# Patient Record
Sex: Male | Born: 1985 | Race: White | Hispanic: No | Marital: Married | Smoking: Former smoker
Health system: Southern US, Community
[De-identification: ages and names within clinical notes are randomized; demographics above are authoritative.]

## PROBLEM LIST (undated history)

## (undated) DIAGNOSIS — I10 Essential (primary) hypertension: Secondary | ICD-10-CM

## (undated) DIAGNOSIS — R4589 Other symptoms and signs involving emotional state: Secondary | ICD-10-CM

---

## 2019-01-13 ENCOUNTER — Ambulatory Visit: Payer: Self-pay | Admitting: Physician Assistant

## 2019-12-26 ENCOUNTER — Emergency Department (HOSPITAL_COMMUNITY): Admission: EM | Admit: 2019-12-26 | Discharge: 2019-12-26 | Discharge status: Against Medical Advice | Payer: Self-pay

## 2019-12-26 ENCOUNTER — Other Ambulatory Visit: Payer: Self-pay

## 2019-12-26 ENCOUNTER — Encounter (HOSPITAL_COMMUNITY): Payer: Self-pay | Admitting: Emergency Medicine

## 2019-12-26 ENCOUNTER — Emergency Department (HOSPITAL_COMMUNITY): Payer: Self-pay

## 2019-12-26 ENCOUNTER — Emergency Department (HOSPITAL_COMMUNITY)
Admission: EM | Admit: 2019-12-26 | Discharge: 2019-12-26 | Disposition: A | Discharge status: Home / Self Care | Payer: Self-pay | Attending: Emergency Medicine | Admitting: Emergency Medicine

## 2019-12-26 DIAGNOSIS — I10 Essential (primary) hypertension: Secondary | ICD-10-CM | POA: Insufficient documentation

## 2019-12-26 DIAGNOSIS — Z87891 Personal history of nicotine dependence: Secondary | ICD-10-CM | POA: Insufficient documentation

## 2019-12-26 DIAGNOSIS — U071 COVID-19: Secondary | ICD-10-CM | POA: Insufficient documentation

## 2019-12-26 HISTORY — DX: Essential (primary) hypertension: I10

## 2019-12-26 LAB — POC SARS CORONAVIRUS 2 AG -  ED: SARS Coronavirus 2 Ag: POSITIVE — AB

## 2019-12-26 MED ORDER — ONDANSETRON HCL 4 MG/2ML IJ SOLN
4.0000 mg | Freq: Once | INTRAMUSCULAR | Status: AC
  Administered 2019-12-26: 4 mg via INTRAVENOUS
  Filled 2019-12-26: qty 2

## 2019-12-26 MED ORDER — ONDANSETRON 4 MG PO TBDP: ORAL_TABLET | ORAL | 0 refills | Status: DC

## 2019-12-26 MED ORDER — ACETAMINOPHEN 500 MG PO TABS
1000.0000 mg | ORAL_TABLET | Freq: Once | ORAL | Status: AC
  Administered 2019-12-26: 1000 mg via ORAL
  Filled 2019-12-26: qty 2

## 2019-12-26 MED ORDER — LISINOPRIL 10 MG PO TABS
20.0000 mg | ORAL_TABLET | Freq: Once | ORAL | Status: AC
  Administered 2019-12-26: 21:00:00 20 mg via ORAL
  Filled 2019-12-26: qty 2

## 2019-12-26 NOTE — ED Provider Notes (Signed)
[facility] EMERGENCY DEPARTMENT Provider Note   CSN: [id number] Arrival date & time: 12/26/19  2022     History Chief Complaint  Patient presents with  . Fever    Lance Gutierrez is a 34 y.o. male.  Lance Gutierrez is a 34 y.o. male with history of hypertension, who presents to the ED for evaluation of 2 days of fevers, chills, cough, headache, nausea and vomiting.  He reports symptoms initially started with a cough that has been nonproductive.  Last night he felt feverish but was not able to check his temperature, had chills, took Tylenol Cold and flu this morning with some improvement and took a second dose around 4:30 or 5 PM this evening.  He denies associated rhinorrhea or sore throat.  He has had some mild shortness of breath, but denies chest pain.  Last night he had nausea with multiple episodes of vomiting, he has not vomited since 645 this morning but is continued to have nausea and decreased appetite.  He states he only has abdominal pain right before he needs to vomit, denies diarrhea.  No blood in the emesis.  He has not had a previous Covid infection or the Covid vaccine, denies any known sick contacts.  Noted to be hypertensive, states he has not taken his blood pressure medicine in several weeks, states he thinks he supposed to be on lisinopril, has the medication at home just does not take it regularly.        Past Medical History:  Diagnosis Date  . Hypertension     There are no problems to display for this patient.   History reviewed. No pertinent surgical history.     No family history on file.  Social History   Tobacco Use  . Smoking status: Former Research scientist (life sciences)  . Smokeless tobacco: Never Used  Substance Use Topics  . Alcohol use: Not Currently  . Drug use: Yes    Types: Marijuana    Home Medications Prior to Admission medications   Medication Sig Start Date End Date Taking? Authorizing Provider  ondansetron (ZOFRAN ODT) 4 MG disintegrating tablet 4mg  ODT q4  hours prn nausea/vomit 12/26/19   Jacqlyn Larsen, PA-C    Allergies    Penicillins  Review of Systems   Review of Systems  Constitutional: Positive for chills and fever.  HENT: Negative.   Respiratory: Positive for cough and shortness of breath.   Cardiovascular: Negative for chest pain.  Gastrointestinal: Positive for diarrhea, nausea and vomiting. Negative for abdominal pain.  Genitourinary: Negative for dysuria and frequency.  Musculoskeletal: Negative for arthralgias, myalgias, neck pain and neck stiffness.  Skin: Negative for color change and rash.  Neurological: Positive for headaches. Negative for dizziness, syncope and light-headedness.  All other systems reviewed and are negative.   Physical Exam Updated Vital Signs BP (!) 189/142   Pulse 71   Temp 97.8 F (36.6 C)   Resp 18   Ht 5\' 8"  (1.727 m)   Wt 108.9 kg   SpO2 100%   BMI 36.49 kg/m   Physical Exam Vitals and nursing note reviewed.  Constitutional:      General: He is not in acute distress.    Appearance: Normal appearance. He is well-developed. He is not ill-appearing or diaphoretic.  HENT:     Head: Normocephalic and atraumatic.     Mouth/Throat:     Mouth: Mucous membranes are moist.     Pharynx: Oropharynx is clear.  Eyes:     General:  Right eye: No discharge.        Left eye: No discharge.  Neck:     Comments: No rigidity Cardiovascular:     Rate and Rhythm: Normal rate and regular rhythm.     Pulses: Normal pulses.     Heart sounds: Normal heart sounds. No murmur. No friction rub. No gallop.   Pulmonary:     Effort: Pulmonary effort is normal. No respiratory distress.     Breath sounds: Normal breath sounds.     Comments: Respirations equal and unlabored, patient able to speak in full sentences, lungs clear to auscultation bilaterally Abdominal:     General: Bowel sounds are normal. There is no distension.     Palpations: Abdomen is soft. There is no mass.     Tenderness: There is  no abdominal tenderness. There is no guarding.     Comments: Abdomen soft, nondistended, nontender to palpation in all quadrants without guarding or peritoneal signs  Musculoskeletal:        General: No deformity.     Cervical back: Neck supple.  Lymphadenopathy:     Cervical: No cervical adenopathy.  Skin:    General: Skin is warm and dry.     Capillary Refill: Capillary refill takes less than 2 seconds.  Neurological:     Mental Status: He is alert and oriented to person, place, and time.  Psychiatric:        Mood and Affect: Mood normal.        Behavior: Behavior normal.     ED Results / Procedures / Treatments   Labs (all labs ordered are listed, but only abnormal results are displayed) Labs Reviewed  POC SARS CORONAVIRUS 2 AG -  ED - Abnormal; Notable for the following components:      Result Value   SARS Coronavirus 2 Ag POSITIVE (*)    All other components within normal limits    EKG None  Radiology DG Chest Port 1 View  Result Date: 12/26/2019 CLINICAL DATA:  Fever nausea vomiting EXAM: PORTABLE CHEST 1 VIEW COMPARISON:  08/07/2019, 06/24/2019 FINDINGS: The heart size and mediastinal contours are within normal limits. Both lungs are clear. The visualized skeletal structures are unremarkable. IMPRESSION: No active disease. Electronically Signed   By: Jasmine Pang M.D.   On: 12/26/2019 21:05    Procedures Procedures (including critical care time)  Medications Ordered in ED Medications  acetaminophen (TYLENOL) tablet 1,000 mg (1,000 mg Oral Given 12/26/19 2112)  ondansetron (ZOFRAN) injection 4 mg (4 mg Intravenous Given 12/26/19 2112)  lisinopril (ZESTRIL) tablet 20 mg (20 mg Oral Given 12/26/19 2126)    ED Course  I have reviewed the triage vital signs and the nursing notes.  Pertinent labs & imaging results that were available during my care of the patient were reviewed by me and considered in my medical decision making (see chart for details).    MDM  Rules/Calculators/A&P                       Patient presents with fevers, chills, cough, nausea, vomiting and headache over the past 2 days, tested positive for Covid which explains patient's symptoms.  His chest x-ray is clear, he ambulated in the room and maintained normal O2 sats.  Patient is noted to be hypertensive today does not have symptoms to suggest hypertensive urgency or emergency and has not been taking his blood pressure medications over the past few weeks, he was given a dose of  his home lisinopril today and counseled on the importance of taking his medication, discussed long-term consequences of untreated hypertension.  At this time I feel patient is stable for discharge home with continued symptomatic treatment of his COVID-19 infection, discussed appropriate outpatient medications, Zofran provided for nausea.  Discussed strict return precautions.  Patient expresses understanding and agreement with plan.  Discharged home in good condition.  Lance Gutierrez was evaluated in Emergency Department on 12/26/2019 for the symptoms described in the history of present illness. He was evaluated in the context of the global COVID-19 pandemic, which necessitated consideration that the patient might be at risk for infection with the SARS-CoV-2 virus that causes COVID-19. Institutional protocols and algorithms that pertain to the evaluation of patients at risk for COVID-19 are in a state of rapid change based on information released by regulatory bodies including the CDC and federal and state organizations. These policies and algorithms were followed during the patient's care in the ED.  Final Clinical Impression(s) / ED Diagnoses Final diagnoses:  COVID-19 virus infection    Rx / DC Orders ED Discharge Orders         Ordered    ondansetron (ZOFRAN ODT) 4 MG disintegrating tablet     12/26/19 2144           Dartha Lodge, PA-C 12/26/19 2201    Milagros Loll, MD 12/26/19 2358

## 2019-12-26 NOTE — ED Notes (Signed)
O2 remained above 96% while ambulating, HR in 110s. pt denies SOB, steady gait. PA notified

## 2019-12-26 NOTE — Discharge Instructions (Addendum)
You have tested positive for COVID-19 virus.  Please continue to quarantine at home and monitor your symptoms closely. You chest x-ray was clear. Antibiotics are not helpful in treating viral infection, the virus should run its course in about 10-14 days. Please make sure you are drinking plenty of fluids. You can treat your symptoms supportively with tylenol for fevers and pains, and over the counter cough syrups and throat lozenges to help with cough.  You can use prescribed Zofran as needed for nausea and vomiting.  If your symptoms are not improving please follow up with you Primary doctor.   I recommend that you purchase a home pulse ox to help better monitor your oxygen at home, if you start to have increased work of breathing or shortness of breath or your oxygen drops below 90% please immediately return to the hospital for reevaluation   If you develop persistent fevers, shortness of breath or difficulty breathing, chest pain, severe headache and neck pain, persistent nausea and vomiting or other new or concerning symptoms return to the Emergency department.  Please TAKE YOUR BLOOD PRESSURE MEDICINE

## 2019-12-26 NOTE — ED Triage Notes (Signed)
Pt c/o fever, n/v, cough and headache x 2 days.

## 2019-12-27 ENCOUNTER — Telehealth: Payer: Self-pay | Admitting: Unknown Physician Specialty

## 2019-12-27 NOTE — Telephone Encounter (Signed)
Called to discuss with patient about Covid symptoms and the use of bamlanivimab, a monoclonal antibody infusion for those with mild to moderate Covid symptoms and at a high risk of hospitalization.  Pt is qualified for this infusion at the [facility] infusion center due to [employer] not set up and unable to leave message

## 2020-04-16 ENCOUNTER — Other Ambulatory Visit: Payer: Self-pay

## 2020-04-16 ENCOUNTER — Encounter (HOSPITAL_COMMUNITY): Payer: Self-pay | Admitting: Emergency Medicine

## 2020-04-16 ENCOUNTER — Emergency Department (HOSPITAL_COMMUNITY)
Admission: EM | Admit: 2020-04-16 | Discharge: 2020-04-17 | Disposition: A | Discharge status: Home / Self Care | Payer: Self-pay | Attending: Emergency Medicine | Admitting: Emergency Medicine

## 2020-04-16 DIAGNOSIS — Z87891 Personal history of nicotine dependence: Secondary | ICD-10-CM | POA: Insufficient documentation

## 2020-04-16 DIAGNOSIS — L551 Sunburn of second degree: Secondary | ICD-10-CM | POA: Insufficient documentation

## 2020-04-16 DIAGNOSIS — I10 Essential (primary) hypertension: Secondary | ICD-10-CM | POA: Insufficient documentation

## 2020-04-16 NOTE — ED Triage Notes (Signed)
Pt c/o sunburn to his back. Redness and diffuse blisters noted.

## 2020-04-17 MED ORDER — OXYCODONE HCL 5 MG PO TABS
5.0000 mg | ORAL_TABLET | Freq: Once | ORAL | Status: AC
  Administered 2020-04-17: 5 mg via ORAL
  Filled 2020-04-17: qty 1

## 2020-04-17 MED ORDER — OXYCODONE HCL 5 MG PO TABS: 5.0000 mg | ORAL_TABLET | ORAL | 0 refills | Status: DC | PRN
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
## 2021-04-21 IMAGING — DX DG CHEST 1V PORT
1 series · 1 of 1 positions shown · non-contrast
Comparison: 08/07/2019, 06/24/2019

CLINICAL DATA: Fever nausea vomiting

EXAM:
PORTABLE CHEST 1 VIEW

[chest ap grid]
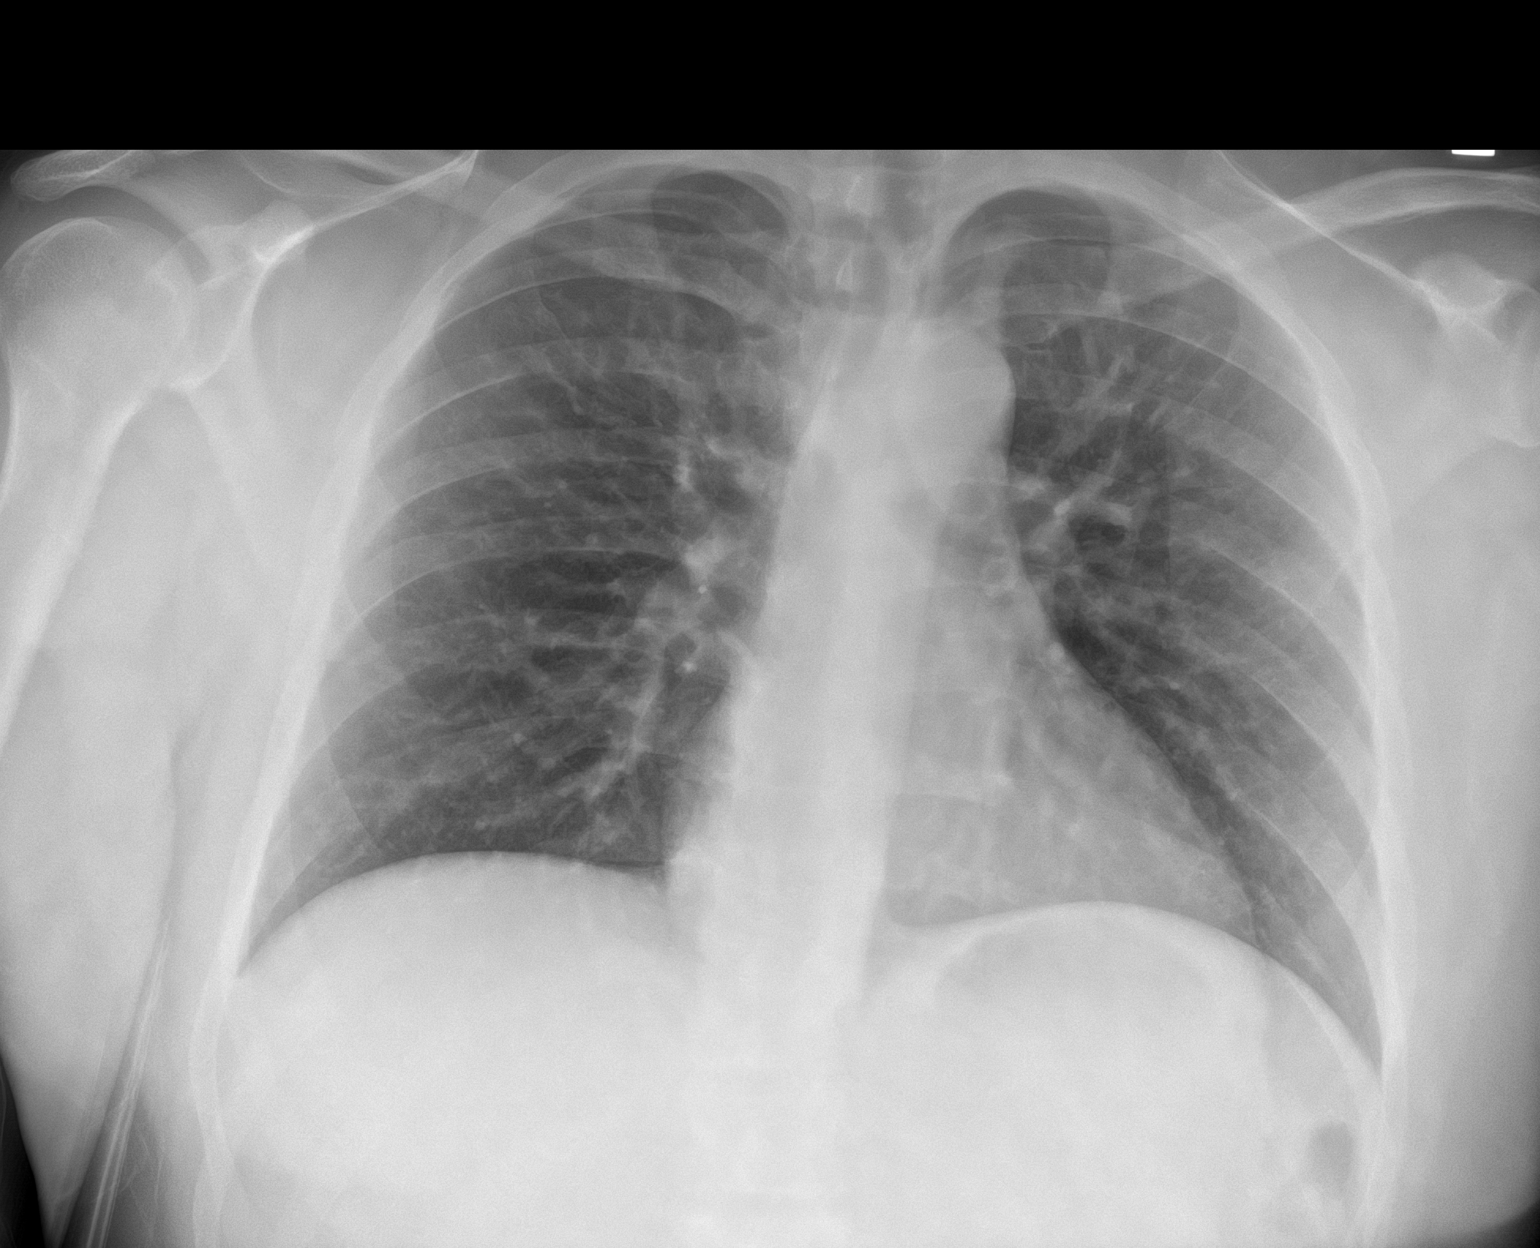

[1 of 1 positions shown; findings below may reference images not displayed]

FINDINGS: The heart size and mediastinal contours are within normal limits.
Both lungs are clear. The visualized skeletal structures are
unremarkable.
IMPRESSION: No active disease.

## 2024-03-09 ENCOUNTER — Emergency Department (HOSPITAL_COMMUNITY)
Admission: EM | Admit: 2024-03-09 | Discharge: 2024-03-09 | Disposition: A | Discharge status: Other Acute Inpatient Hospital | Payer: Self-pay | Attending: Emergency Medicine | Admitting: Emergency Medicine

## 2024-03-09 ENCOUNTER — Emergency Department (HOSPITAL_COMMUNITY): Payer: Self-pay

## 2024-03-09 ENCOUNTER — Encounter (HOSPITAL_COMMUNITY): Payer: Self-pay

## 2024-03-09 ENCOUNTER — Other Ambulatory Visit: Payer: Self-pay

## 2024-03-09 ENCOUNTER — Inpatient Hospital Stay (HOSPITAL_COMMUNITY)
Admission: AD | Admit: 2024-03-09 | Discharge: 2024-03-12 | DRG: 885 | Disposition: A | Discharge status: Home / Self Care | Source: Intra-hospital | Attending: Psychiatry | Admitting: Psychiatry

## 2024-03-09 ENCOUNTER — Encounter (HOSPITAL_COMMUNITY): Payer: Self-pay | Admitting: Family Medicine

## 2024-03-09 DIAGNOSIS — F322 Major depressive disorder, single episode, severe without psychotic features: Principal | ICD-10-CM | POA: Diagnosis present

## 2024-03-09 DIAGNOSIS — R45851 Suicidal ideations: Secondary | ICD-10-CM | POA: Diagnosis present

## 2024-03-09 DIAGNOSIS — I10 Essential (primary) hypertension: Secondary | ICD-10-CM | POA: Diagnosis present

## 2024-03-09 DIAGNOSIS — Z818 Family history of other mental and behavioral disorders: Secondary | ICD-10-CM

## 2024-03-09 DIAGNOSIS — F323 Major depressive disorder, single episode, severe with psychotic features: Secondary | ICD-10-CM

## 2024-03-09 DIAGNOSIS — F17210 Nicotine dependence, cigarettes, uncomplicated: Secondary | ICD-10-CM | POA: Diagnosis present

## 2024-03-09 DIAGNOSIS — R4589 Other symptoms and signs involving emotional state: Secondary | ICD-10-CM

## 2024-03-09 DIAGNOSIS — F333 Major depressive disorder, recurrent, severe with psychotic symptoms: Secondary | ICD-10-CM | POA: Insufficient documentation

## 2024-03-09 DIAGNOSIS — Z79899 Other long term (current) drug therapy: Secondary | ICD-10-CM | POA: Insufficient documentation

## 2024-03-09 DIAGNOSIS — E559 Vitamin D deficiency, unspecified: Secondary | ICD-10-CM | POA: Diagnosis present

## 2024-03-09 DIAGNOSIS — F22 Delusional disorders: Secondary | ICD-10-CM | POA: Diagnosis not present

## 2024-03-09 DIAGNOSIS — Z880 Allergy status to penicillin: Secondary | ICD-10-CM | POA: Diagnosis not present

## 2024-03-09 DIAGNOSIS — Z823 Family history of stroke: Secondary | ICD-10-CM | POA: Diagnosis not present

## 2024-03-09 HISTORY — DX: Other symptoms and signs involving emotional state: R45.89

## 2024-03-09 LAB — CBC
HCT: 46.3 % (ref 39.0–52.0)
Hemoglobin: 15.8 g/dL (ref 13.0–17.0)
MCH: 27.2 pg (ref 26.0–34.0)
MCHC: 34.1 g/dL (ref 30.0–36.0)
MCV: 79.7 fL — ABNORMAL LOW (ref 80.0–100.0)
Platelets: 306 10*3/uL (ref 150–400)
RBC: 5.81 MIL/uL (ref 4.22–5.81)
RDW: 13.3 % (ref 11.5–15.5)
WBC: 12.9 10*3/uL — ABNORMAL HIGH (ref 4.0–10.5)
nRBC: 0 % (ref 0.0–0.2)

## 2024-03-09 LAB — COMPREHENSIVE METABOLIC PANEL WITH GFR
ALT: 26 U/L (ref 0–44)
AST: 16 U/L (ref 15–41)
Albumin: 4.3 g/dL (ref 3.5–5.0)
Alkaline Phosphatase: 77 U/L (ref 38–126)
Anion gap: 9 (ref 5–15)
BUN: 15 mg/dL (ref 6–20)
CO2: 24 mmol/L (ref 22–32)
Calcium: 9.2 mg/dL (ref 8.9–10.3)
Chloride: 105 mmol/L (ref 98–111)
Creatinine, Ser: 0.92 mg/dL (ref 0.61–1.24)
GFR, Estimated: >60 mL/min (ref >60)
Glucose, Bld: 115 mg/dL — ABNORMAL HIGH (ref 70–99)
Potassium: 3.5 mmol/L (ref 3.5–5.1)
Sodium: 138 mmol/L (ref 135–145)
Total Bilirubin: 1 mg/dL (ref 0.0–1.2)
Total Protein: 7.3 g/dL (ref 6.5–8.1)

## 2024-03-09 LAB — RAPID URINE DRUG SCREEN, HOSP PERFORMED
Amphetamines: NOT DETECTED
Barbiturates: NOT DETECTED
Benzodiazepines: NOT DETECTED
Cocaine: NOT DETECTED
Opiates: NOT DETECTED
Tetrahydrocannabinol: POSITIVE — AB

## 2024-03-09 LAB — ETHANOL: Alcohol, Ethyl (B): <15 mg/dL (ref <15)

## 2024-03-09 LAB — VITAMIN D 25 HYDROXY (VIT D DEFICIENCY, FRACTURES): Vit D, 25-Hydroxy: 26.31 ng/mL — ABNORMAL LOW (ref 30–100)

## 2024-03-09 MED ORDER — ACETAMINOPHEN 325 MG PO TABS
650.0000 mg | ORAL_TABLET | Freq: Four times a day (QID) | ORAL | Status: DC | PRN
Start: 2024-03-09 — End: 2024-03-12

## 2024-03-09 MED ORDER — AMLODIPINE BESYLATE 10 MG PO TABS
10.0000 mg | ORAL_TABLET | Freq: Every day | ORAL | Status: DC
  Administered 2024-03-10 – 2024-03-12 (×3): 10 mg via ORAL
  Filled 2024-03-09 (×3): qty 1

## 2024-03-09 MED ORDER — AMLODIPINE BESYLATE 5 MG PO TABS
10.0000 mg | ORAL_TABLET | Freq: Once | ORAL | Status: AC
  Administered 2024-03-09: 10 mg via ORAL
  Filled 2024-03-09: qty 2

## 2024-03-09 MED ORDER — HALOPERIDOL 5 MG PO TABS
5.0000 mg | ORAL_TABLET | Freq: Three times a day (TID) | ORAL | Status: DC | PRN
Start: 2024-03-09 — End: 2024-03-12

## 2024-03-09 MED ORDER — TRAZODONE HCL 50 MG PO TABS
50.0000 mg | ORAL_TABLET | Freq: Every evening | ORAL | Status: DC | PRN
  Administered 2024-03-11: 50 mg via ORAL
  Filled 2024-03-09 (×2): qty 1

## 2024-03-09 MED ORDER — ALUM & MAG HYDROXIDE-SIMETH 200-200-20 MG/5ML PO SUSP: 30.0000 mL | ORAL | Status: DC | PRN

## 2024-03-09 MED ORDER — DIPHENHYDRAMINE HCL 25 MG PO CAPS: 50.0000 mg | ORAL_CAPSULE | Freq: Three times a day (TID) | ORAL | Status: DC | PRN

## 2024-03-09 MED ORDER — LORAZEPAM 2 MG/ML IJ SOLN: 2.0000 mg | Freq: Three times a day (TID) | INTRAMUSCULAR | Status: DC | PRN

## 2024-03-09 MED ORDER — MAGNESIUM HYDROXIDE 400 MG/5ML PO SUSP: 30.0000 mL | Freq: Every day | ORAL | Status: DC | PRN

## 2024-03-09 MED ORDER — DIPHENHYDRAMINE HCL 50 MG/ML IJ SOLN: 50.0000 mg | Freq: Three times a day (TID) | INTRAMUSCULAR | Status: DC | PRN

## 2024-03-09 MED ORDER — HYDROXYZINE HCL 50 MG PO TABS
50.0000 mg | ORAL_TABLET | Freq: Once | ORAL | Status: AC
  Administered 2024-03-09: 50 mg via ORAL
  Filled 2024-03-09: qty 1

## 2024-03-09 MED ORDER — HYDROXYZINE HCL 25 MG PO TABS
25.0000 mg | ORAL_TABLET | Freq: Three times a day (TID) | ORAL | Status: DC | PRN
  Administered 2024-03-10: 25 mg via ORAL
  Filled 2024-03-09 (×2): qty 1

## 2024-03-09 MED ORDER — CLONIDINE HCL 0.1 MG PO TABS
0.1000 mg | ORAL_TABLET | Freq: Once | ORAL | Status: AC
  Administered 2024-03-09: 0.1 mg via ORAL
  Filled 2024-03-09: qty 1

## 2024-03-09 MED ORDER — OLANZAPINE 5 MG PO TBDP: 5.0000 mg | ORAL_TABLET | Freq: Two times a day (BID) | ORAL | Status: DC | PRN

## 2024-03-09 MED ORDER — OLANZAPINE 5 MG PO TBDP
5.0000 mg | ORAL_TABLET | Freq: Two times a day (BID) | ORAL | Status: DC
  Filled 2024-03-09 (×2): qty 1

## 2024-03-09 MED ORDER — HALOPERIDOL LACTATE 5 MG/ML IJ SOLN: 5.0000 mg | Freq: Three times a day (TID) | INTRAMUSCULAR | Status: DC | PRN

## 2024-03-09 MED ORDER — HALOPERIDOL LACTATE 5 MG/ML IJ SOLN: 10.0000 mg | Freq: Three times a day (TID) | INTRAMUSCULAR | Status: DC | PRN
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
## 2024-03-09 NOTE — ED Notes (Signed)
 MD made aware of Patients current Blood Pressure.

## 2024-03-09 NOTE — ED Notes (Signed)
 Pt dressed out 2 personal belongings bags placed in locker Security called to wand pt

## 2024-03-09 NOTE — Progress Notes (Signed)
 Pt has been accepted to [facility] on 03/09/2024 Bed assignment:300-1  Pt meets inpatient criteria per Cydney Draft, NP   Attending Physician will be Nadir Linnie Riches, MD   Report can be called [phone].   Pt can arrive pending improved BP   Care Team Notified: [facility] AC Linsey Strader, Cydney Draft, NP, Ewell Holes, RN

## 2024-03-09 NOTE — Progress Notes (Signed)
 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  Will zyprexa cause him work problems?
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
## 2024-03-09 NOTE — Tx Team (Signed)
 Initial Treatment Plan 03/09/2024 7:10 PM [name] [id number]    PATIENT STRESSORS: Financial difficulties   Marital or family conflict     PATIENT STRENGTHS: Capable of independent living  Forensic psychologist fund of knowledge  Motivation for treatment/growth  Work skills    PATIENT IDENTIFIED PROBLEMS: Conflict with mother  SI with plan to shoot self  Financial problems                 DISCHARGE CRITERIA:  Improved stabilization in mood, thinking, and/or behavior Need for constant or close observation no longer present  PRELIMINARY DISCHARGE PLAN: Return to previous living arrangement  PATIENT/FAMILY INVOLVEMENT: This treatment plan has been presented to and reviewed with the patient, Lance Gutierrez.  The patient has been given the opportunity to ask questions and make suggestions.  Christinia Cradle, RN 03/09/2024, 7:10 PM

## 2024-03-09 NOTE — Plan of Care (Signed)

## 2024-03-09 NOTE — BH Assessment (Signed)
 Notified RN via secure chat ready to complete TTS evaluation...Aaron AasAaron Aas pending response

## 2024-03-09 NOTE — Group Note (Signed)
 Date:  03/09/2024 Time:  9:36 PM  Group Topic/Focus:  Wrap-Up Group:   The focus of this group is to help patients review their daily goal of treatment and discuss progress on daily workbooks.    Additional Comments:  Pt was encouraged, but opted out of attending wrap up group this evening.   Eligah Grow 03/09/2024, 9:36 PM

## 2024-03-09 NOTE — ED Provider Notes (Addendum)
  Physical Exam  BP (!) 194/112 (BP Location: Right Arm) Comment: doesnt take meds like he is supposed to  Pulse 93   Temp 98.1 F (36.7 C) (Oral)   Resp 17   Ht 5\' 6"  (1.676 m)   Wt 96.2 kg   SpO2 98%   BMI 34.22 kg/m   Physical Exam  Procedures  Procedures  ED Course / MDM    Medical Decision Making Amount and/or Complexity of Data Reviewed Labs: ordered.  Risk Prescription drug management. Decision regarding hospitalization.   Received in signout.  Suicidal.  Had gone and plan to shoot himself in head.  IVC had been done.  Patient is medically cleared at this time.       Mozell Arias, MD 03/09/24 [phone]  Patient is hypertensive.  Noncompliant with his medications.  I think this is not the cause of the suicidality.  Will start some oral Norvasc.  Head CT had been added.  Head CT reassuring.  Patient is medically cleared.  Would continue blood pressure medicines but does not need his blood pressure acutely lowered.    Mozell Arias, MD 03/09/24 1301  Further discussion with patient.  Was on potentially lisinopril .  Has been off for couple years now.  States has had hypertension since he has been in his 67s.  Do not think we need acute lowering of the blood pressure.  Likely is compensated to this high blood pressure over the last years and do not want to lower it faster than with oral medicines.    Mozell Arias, MD 03/09/24 [phone]

## 2024-03-09 NOTE — Group Note (Signed)
 LCSW Group Therapy Note   Group Date: 03/09/2024 Start Time: 1100 End Time: 1200   Participation:  patient was admitted today (he was not in the hospital when the group was held)  Type of Therapy:  Group Therapy  Topic:  Finding Balance: Using Wise Mind for Thoughtful Decisions  Objective:  To help participants understand and apply the concept of Agustina Aldrich Mind to make balanced, thoughtful decisions by integrating emotion and logic.  Goals: Learn the differences between Emotional Mind, Reasonable Mind, and [employer]. Recognize personal signs of Emotional and Reasonable Mind. Practice using [employer] in real-life scenarios.  Therapeutic Modalities: Elements of Dialectical Behavior Therapy (DBT):  Mindfulness (noticing thoughts and emotions without judgment), Emotion Regulation (understanding and managing emotional responses), Distress Tolerance (coping with difficult situations without making them worse), Wise Mind (integrating emotion and reason for balanced decision-making) Elements of Cognitive Behavioral Therapy (CBT):  Identifying automatic thoughts, Challenging cognitive distortions, Using logic to reframe unhelpful thinking patterns  Summary:  This class focused on Wise Mind - DBT's concept of balancing Emotional Mind and Reasonable Mind. We identified when we're in each state and practiced using Wise Mind to respond thoughtfully in real-life situations. By combining emotion and logic, participants can improve decision-making, manage challenges, and enhance relationships.   Lance Gutierrez O Lance Gutierrez, LCSWA 03/09/2024  4:39 PM

## 2024-03-10 ENCOUNTER — Encounter (HOSPITAL_COMMUNITY): Payer: Self-pay | Admitting: Family Medicine

## 2024-03-10 ENCOUNTER — Encounter (HOSPITAL_COMMUNITY): Payer: Self-pay

## 2024-03-10 DIAGNOSIS — F322 Major depressive disorder, single episode, severe without psychotic features: Principal | ICD-10-CM

## 2024-03-10 DIAGNOSIS — Z046 Encounter for general psychiatric examination, requested by authority: Secondary | ICD-10-CM

## 2024-03-10 DIAGNOSIS — X749XXA Intentional self-harm by unspecified firearm discharge, initial encounter: Secondary | ICD-10-CM

## 2024-03-10 DIAGNOSIS — E559 Vitamin D deficiency, unspecified: Secondary | ICD-10-CM | POA: Diagnosis present

## 2024-03-10 DIAGNOSIS — Z8659 Personal history of other mental and behavioral disorders: Secondary | ICD-10-CM

## 2024-03-10 HISTORY — DX: Personal history of other mental and behavioral disorders: Z86.59

## 2024-03-10 HISTORY — DX: Intentional self-harm by unspecified firearm discharge, initial encounter: X74.9XXA

## 2024-03-10 HISTORY — DX: Major depressive disorder, single episode, severe without psychotic features: F32.2

## 2024-03-10 HISTORY — DX: Encounter for general psychiatric examination, requested by authority: Z04.6

## 2024-03-10 LAB — HEMOGLOBIN A1C
Hgb A1c MFr Bld: 5.2 % (ref 4.8–5.6)
Mean Plasma Glucose: 102.54 mg/dL

## 2024-03-10 LAB — TSH: TSH: 2.775 u[IU]/mL (ref 0.350–4.500)

## 2024-03-10 LAB — VITAMIN B12: Vitamin B-12: 316 pg/mL (ref 180–914)

## 2024-03-10 LAB — LIPID PANEL
Cholesterol: 130 mg/dL (ref 0–200)
HDL: 36 mg/dL — ABNORMAL LOW (ref >40)
LDL Cholesterol: 85 mg/dL (ref 0–99)
Total CHOL/HDL Ratio: 3.6 ratio
Triglycerides: 46 mg/dL (ref <150)
VLDL: 9 mg/dL (ref 0–40)

## 2024-03-10 LAB — FOLATE: Folate: 9.2 ng/mL (ref >5.9)

## 2024-03-10 MED ORDER — ESCITALOPRAM OXALATE 5 MG PO TABS
5.0000 mg | ORAL_TABLET | Freq: Every day | ORAL | Status: DC
  Administered 2024-03-11: 5 mg via ORAL
  Filled 2024-03-10: qty 1

## 2024-03-10 MED ORDER — RISPERIDONE 1 MG PO TBDP: 1.0000 mg | ORAL_TABLET | Freq: Two times a day (BID) | ORAL | Status: DC

## 2024-03-10 MED ORDER — VITAMIN D (ERGOCALCIFEROL) 1.25 MG (50000 UNIT) PO CAPS
50000.0000 [IU] | ORAL_CAPSULE | Freq: Every day | ORAL | Status: DC
  Administered 2024-03-10 – 2024-03-12 (×3): 50000 [IU] via ORAL
  Filled 2024-03-10 (×3): qty 1

## 2024-03-10 MED ORDER — LISINOPRIL 20 MG PO TABS
20.0000 mg | ORAL_TABLET | Freq: Every day | ORAL | Status: DC
  Administered 2024-03-10 – 2024-03-12 (×3): 20 mg via ORAL
  Filled 2024-03-10 (×3): qty 1

## 2024-03-10 NOTE — Group Note (Signed)
 Date:  03/10/2024 Time:  9:12 AM  Group Topic/Focus:  Goals Group:   The focus of this group is to help patients establish daily goals to achieve during treatment and discuss how the patient can incorporate goal setting into their daily lives to aide in recovery.    Participation Level:  Did Not Attend   Lance Gutierrez 03/10/2024, 9:12 AM

## 2024-03-10 NOTE — Progress Notes (Signed)
   03/09/24 2239  Psych Admission Type (Psych Patients Only)  Admission Status Involuntary  Psychosocial Assessment  Patient Complaints None  Eye Contact Fair  Facial Expression Anxious  Affect Anxious  Speech Logical/coherent  Interaction Assertive  Motor Activity Other (Comment) (WDL)  Appearance/Hygiene Unremarkable  Behavior Characteristics Cooperative  Mood Depressed;Anxious;Preoccupied  Thought Process  Coherency WDL  Content WDL  Delusions None reported or observed  Perception WDL  Hallucination None reported or observed  Judgment Impaired  Confusion None  Danger to Self  Current suicidal ideation? Denies  Agreement Not to Harm Self Yes  Description of Agreement verbal  Danger to Others  Danger to Others None reported or observed
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
## 2024-03-10 NOTE — Group Note (Signed)
 Recreation Therapy Group Note   Group Topic:Team Building  Group Date: 03/10/2024 Start Time: 0940 End Time: 1005 Facilitators: Lattie Riege-McCall, LRT,CTRS Location: [address] Dayroom   Group Topic: Communication, Team Building, Problem Solving  Goal Area(s) Addresses:  Patient will effectively work with peer towards shared goal.  Patient will identify skills used to make activity successful.  Patient will identify how skills used during activity can be used to reach post d/c goals.   Behavioral Response: None  Intervention: STEM Activity  Activity: Straw Bridge. In teams of 3-5, patients were given 15 plastic drinking straws and an equal length of masking tape. Using the materials provided, patients were instructed to build a free standing bridge-like structure to suspend an everyday item (ex: puzzle box) off of the floor or table surface. All materials were required to be used by the team in their design. LRT facilitated post-activity discussion reviewing team process. Patients were encouraged to reflect how the skills used in this activity can be generalized to daily life post discharge.   Education: Pharmacist, community, Scientist, physiological, Discharge Planning   Education Outcome: Acknowledges education/In group clarification offered/Needs additional education.    Affect/Mood: Flat   Participation Level: None   Participation Quality: Independent   Behavior: On-looking   Speech/Thought Process: None   Insight: None   Judgement: None   Modes of Intervention: STEM Activity   Patient Response to Interventions:  Avoidant   Education Outcome:  In group clarification offered    Clinical Observations/Individualized Feedback: Pt came in late, observed and left shortly after coming to group.     Plan: Continue to engage patient in RT group sessions 2-3x/week.   Kol Consuegra-McCall, LRT,CTRS 03/10/2024 12:42 PM

## 2024-03-10 NOTE — Plan of Care (Signed)
  Problem: Activity: Goal: Interest or engagement in activities will improve Outcome: Progressing   Patient denies SI, HI, and AVH. Patient has had no behavioral dyscontrol. Patient was noted to have an elevated blood pressure. Physician contacted. Patient offered medication that he refused earlier to treat blood pressure but continued to refuse.
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
## 2024-03-10 NOTE — Group Note (Signed)
 Date:  03/10/2024 Time:  8:51 PM  Group Topic/Focus:  Wrap-Up Group:   The focus of this group is to help patients review their daily goal of treatment and discuss progress on daily workbooks.    Additional Comments:  Pt attended the AA meeting this evening.   Lance Gutierrez 03/10/2024, 8:51 PM

## 2024-03-10 NOTE — Group Note (Signed)
 Date:  03/10/2024 Time:  9:49 PM  Group Topic/Focus:  Wrap-Up Group:   The focus of this group is to help patients review their daily goal of treatment and discuss progress on daily workbooks.    Participation Level:  Active  Participation Quality:  Appropriate  Affect:  Appropriate  Cognitive:  Appropriate  Insight: Appropriate  Engagement in Group:  Engaged  Modes of Intervention:  Discussion  Additional Comments:  Pt had a good day  Dellia Ferguson 03/10/2024, 9:49 PM
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
## 2024-03-10 NOTE — BH IP Treatment Plan (Signed)
 Interdisciplinary Treatment and Diagnostic Plan Update  03/10/2024 Time of Session: 10:50 AM Lance Gutierrez MRN: [id number]  Principal Diagnosis: Major depressive disorder, single episode, severe without psychotic features (HCC)  Secondary Diagnoses: Principal Problem:   Major depressive disorder, single episode, severe without psychotic features (HCC) Active Problems:   Vitamin D insufficiency   Current Medications:  Current Facility-Administered Medications  Medication Dose Route Frequency Provider Last Rate Last Admin   acetaminophen  (TYLENOL ) tablet 650 mg  650 mg Oral Q6H PRN Buena Carmine, NP       alum & mag hydroxide-simeth (MAALOX/MYLANTA) 200-200-20 MG/5ML suspension 30 mL  30 mL Oral Q4H PRN Buena Carmine, NP       amLODipine (NORVASC) tablet 10 mg  10 mg Oral Daily Buena Carmine, NP   10 mg at 03/10/24 0730   haloperidol (HALDOL) tablet 5 mg  5 mg Oral TID PRN Buena Carmine, NP       And   diphenhydrAMINE (BENADRYL) capsule 50 mg  50 mg Oral TID PRN Buena Carmine, NP       haloperidol lactate (HALDOL) injection 5 mg  5 mg Intramuscular TID PRN Buena Carmine, NP       And   diphenhydrAMINE (BENADRYL) injection 50 mg  50 mg Intramuscular TID PRN Buena Carmine, NP       And   LORazepam (ATIVAN) injection 2 mg  2 mg Intramuscular TID PRN Buena Carmine, NP       haloperidol lactate (HALDOL) injection 10 mg  10 mg Intramuscular TID PRN Buena Carmine, NP       And   diphenhydrAMINE (BENADRYL) injection 50 mg  50 mg Intramuscular TID PRN Buena Carmine, NP       And   LORazepam (ATIVAN) injection 2 mg  2 mg Intramuscular TID PRN Buena Carmine, NP       Lance Gutierrez ON 03/11/2024] escitalopram (LEXAPRO) tablet 5 mg  5 mg Oral Daily Gutierrez, Lance S, MD       hydrOXYzine (ATARAX) tablet 25 mg  25 mg Oral TID PRN Buena Carmine, NP   25 mg at 03/10/24 1323   lisinopril  (ZESTRIL ) tablet 20 mg  20 mg Oral Daily Gutierrez, Lance S, MD        magnesium hydroxide (MILK OF MAGNESIA) suspension 30 mL  30 mL Oral Daily PRN Buena Carmine, NP       traZODone (DESYREL) tablet 50 mg  50 mg Oral QHS PRN Buena Carmine, NP       Vitamin D (Ergocalciferol) (DRISDOL) 1.25 MG (50000 UNIT) capsule 50,000 Units  50,000 Units Oral Daily Gutierrez, Lance S, MD   50,000 Units at 03/10/24 1441   PTA Medications: Medications Prior to Admission  Medication Sig Dispense Refill Last Dose/Taking   aspirin EC 81 MG tablet Take 81 mg by mouth in the morning and at bedtime. Swallow whole.       Patient Stressors: Financial difficulties   Marital or family conflict    Patient Strengths: Capable of independent living  Forensic psychologist fund of knowledge  Motivation for treatment/growth  Work skills   Treatment Modalities: Medication Management, Group therapy, Case management,  1 to 1 session with clinician, Psychoeducation, Recreational therapy.   Physician Treatment Plan for Primary Diagnosis: Major depressive disorder, single episode, severe without psychotic features (HCC) Long Term Goal(s):     Short Term Goals:    Medication Management: Evaluate patient's response,  side effects, and tolerance of medication regimen.  Therapeutic Interventions: 1 to 1 sessions, Unit Group sessions and Medication administration.  Evaluation of Outcomes: Not Progressing  Physician Treatment Plan for Secondary Diagnosis: Principal Problem:   Major depressive disorder, single episode, severe without psychotic features (HCC) Active Problems:   Vitamin D insufficiency  Long Term Goal(s):     Short Term Goals:       Medication Management: Evaluate patient's response, side effects, and tolerance of medication regimen.  Therapeutic Interventions: 1 to 1 sessions, Unit Group sessions and Medication administration.  Evaluation of Outcomes: Not Progressing   RN Treatment Plan for Primary Diagnosis: Major depressive disorder, single episode,  severe without psychotic features (HCC) Long Term Goal(s): Knowledge of disease and therapeutic regimen to maintain health will improve  Short Term Goals: Ability to remain free from injury will improve, Ability to verbalize frustration and anger appropriately will improve, Ability to demonstrate self-control, Ability to participate in decision making will improve, Ability to verbalize feelings will improve, Ability to disclose and discuss suicidal ideas, Ability to identify and develop effective coping behaviors will improve, and Compliance with prescribed medications will improve  Medication Management: RN will administer medications as ordered by provider, will assess and evaluate patient's response and provide education to patient for prescribed medication. RN will report any adverse and/or side effects to prescribing provider.  Therapeutic Interventions: 1 on 1 counseling sessions, Psychoeducation, Medication administration, Evaluate responses to treatment, Monitor vital signs and CBGs as ordered, Perform/monitor CIWA, COWS, AIMS and Fall Risk screenings as ordered, Perform wound care treatments as ordered.  Evaluation of Outcomes: Not Progressing   LCSW Treatment Plan for Primary Diagnosis: Major depressive disorder, single episode, severe without psychotic features (HCC) Long Term Goal(s): Safe transition to appropriate next level of care at discharge, Engage patient in therapeutic group addressing interpersonal concerns.  Short Term Goals: Engage patient in aftercare planning with referrals and resources, Increase social support, Increase ability to appropriately verbalize feelings, Increase emotional regulation, Facilitate acceptance of mental health diagnosis and concerns, Facilitate patient progression through stages of change regarding substance use diagnoses and concerns, Identify triggers associated with mental health/substance abuse issues, and Increase skills for wellness and  recovery  Therapeutic Interventions: Assess for all discharge needs, 1 to 1 time with Social worker, Explore available resources and support systems, Assess for adequacy in community support network, Educate family and significant other(s) on suicide prevention, Complete Psychosocial Assessment, Interpersonal group therapy.  Evaluation of Outcomes: Not Progressing   Progress in Treatment: Attending groups: attended some groups Participating in groups: Yes. Taking medication as prescribed: Yes. Toleration medication: Yes. Family/Significant other contact made:  Yes. Contacted Shella Devoid [phone] Patient understands diagnosis: Yes. Discussing patient identified problems/goals with staff: Yes. Medical problems stabilized or resolved: Yes. Denies suicidal/homicidal ideation: Yes. Issues/concerns per patient self-inventory: No.  New problem(s) identified: No  New Short Term/Long Term Goal(s):    medication stabilization, elimination of SI thoughts, development of comprehensive mental wellness plan.    Patient Goals:  "I want to improve my people skills and work on myself so I can get better and leave here."   Discharge Plan or Barriers:  Patient recently admitted. CSW will continue to follow and assess for appropriate referrals and possible discharge planning.     Reason for Continuation of Hospitalization: Depression Medication stabilization Suicidal ideation  Estimated Length of Stay:  5 - 7 days  Last 3 [place] Suicide Severity Risk Score: Flowsheet Row Ad[facility] (Current) [facility] Most recent reading at 03/09/2024  4:00 PM ED from 03/09/2024 in [facility] Emergency Department at [facility] Most recent reading at 03/09/2024  5:50 AM  C-SSRS RISK CATEGORY High Risk High Risk       Last PHQ 2/9 Scores:     No data to display          Scribe for Treatment Team: Kathye Cipriani O Ceola Para,  LCSWA 03/10/2024 6:23 PM

## 2024-03-10 NOTE — [facility] Suicide Risk Assessment (Signed)
 [facility] INPATIENT:  Family/Significant Other Suicide Prevention Education  Suicide Prevention Education:  Education Completed; Lance Gutierrez [phone],  (name of family member/significant other) has been identified by the patient as the family member/significant other with whom the patient will be residing, and identified as the person(s) who will aid the patient in the event of a mental health crisis (suicidal ideations/suicide attempt).  With written consent from the patient, the family member/significant other has been provided the following suicide prevention education, prior to the and/or following the discharge of the patient.  Has known patient for 1.5 months, is supposed to be buying pt's home. Believes that pt is afraid for his life (mother is aggressive).  Does not believe pt has access to weapons or firearms as they have been removed due to mother being aggressive in the past.   Lance Gutierrez called the sheriff and animal control today about going to pt's house to let out his dogs, they report he is unable to do so because if the pt's mother (even though not physically at the property) claims they are her dogs and not pt's, Lance Gutierrez can get arrested for stealing the animals.   Unable to pick pt up at discharge but plans to meet him at the house when he is released. Lance Gutierrez is hoping pt can discharge soon to care for his dogs.   The suicide prevention education provided includes the following: Suicide risk factors Suicide prevention and interventions National Suicide Hotline telephone number [facility] assessment telephone number [facility] Emergency Assistance 911 [facility] and/or Residential Mobile Crisis Unit telephone number  Request made of family/significant other to: Remove weapons (e.g., guns, rifles, knives), all items previously/currently identified as safety concern.   Remove drugs/medications (over-the-counter, prescriptions, illicit drugs), all items  previously/currently identified as a safety concern.  The family member/significant other verbalizes understanding of the suicide prevention education information provided.  The family member/significant other agrees to remove the items of safety concern listed above.  Lance Gutierrez 03/10/2024, 3:54 PM

## 2024-03-11 DIAGNOSIS — F322 Major depressive disorder, single episode, severe without psychotic features: Secondary | ICD-10-CM | POA: Diagnosis not present

## 2024-03-11 LAB — HIV ANTIBODY (ROUTINE TESTING W REFLEX): HIV Screen 4th Generation wRfx: NONREACTIVE

## 2024-03-11 LAB — RPR: RPR Ser Ql: NONREACTIVE

## 2024-03-11 MED ORDER — ENSURE PLUS HIGH PROTEIN PO LIQD
237.0000 mL | Freq: Two times a day (BID) | ORAL | Status: DC
  Filled 2024-03-11 (×2): qty 237

## 2024-03-11 MED ORDER — ESCITALOPRAM OXALATE 10 MG PO TABS
10.0000 mg | ORAL_TABLET | Freq: Every day | ORAL | Status: DC
  Administered 2024-03-12: 10 mg via ORAL
  Filled 2024-03-11: qty 1

## 2024-03-11 MED ORDER — HYDROXYZINE HCL 50 MG PO TABS
50.0000 mg | ORAL_TABLET | Freq: Three times a day (TID) | ORAL | Status: DC
  Administered 2024-03-11 – 2024-03-12 (×4): 50 mg via ORAL
  Filled 2024-03-11 (×4): qty 1

## 2024-03-11 NOTE — [facility] Group Notes (Signed)
 [facility] Group Notes:  (Nursing/MHT/Case Management/Adjunct)  Date:  03/11/2024  Time:  2000  Type of Therapy:  Wrap up group  Participation Level:  Active  Participation Quality:  Appropriate, Attentive, Sharing, and Supportive  Affect:  Depressed  Cognitive:  Alert  Insight:  Improving  Engagement in Group:  Developing/Improving  Modes of Intervention:  Clarification, Education, and Socialization  Summary of Progress/Problems: Positive thinking and self-care were discussed.   Catharine Clock 03/11/2024, 9:33 PM

## 2024-03-11 NOTE — Progress Notes (Signed)
 [facility] MD Progress Note  03/11/2024 12:02 PM Lance Gutierrez  MRN:  [id number] Principal Problem: Major depressive disorder, single episode, severe without psychotic features (HCC) Diagnosis: Principal Problem:   Major depressive disorder, single episode, severe without psychotic features (HCC) Active Problems:   Vitamin D  insufficiency   ID & Admission Information: Lance Gutierrez is an 38 y.o. male who  has a past medical history of psychiatric hospitalization (03/10/2024), Hypertension, Involuntary commitment (03/10/2024), Major depressive disorder, single episode, severe without psychotic features (HCC) (03/10/2024), Suicide attempt by firearm [facility]) (03/10/2024), and Vitamin D  insufficiency (03/10/2024).  He presented on 03/09/2024  3:16 PM for Major depressive disorder, single episode, severe without psychotic features (HCC).   Chief Complaint: "I am anxious"  Subjective:   Case was discussed in the multidisciplinary team. MAR was reviewed and patient was compliant with medications.   Patient was seen in the interview room during rounds.  Actually found me in the hallway and requested to speak.  He states that he is willing to start a medication for anxiety and depression.  We discussed starting Lexapro .  We also discussed starting scheduled hydroxyzine  as he is quite anxious.  The patient denies suicidal ideation, homicidal ideation, auditory hallucinations, or visual hallucinations.  He denies medication side effects.    Past Psychiatric and Medical Medical History:  Past Medical History:  Diagnosis Date   Hx of psychiatric hospitalization 03/10/2024   Hypertension    Involuntary commitment 03/10/2024   Major depressive disorder, single episode, severe without psychotic features (HCC) 03/10/2024   Suicide attempt by firearm [facility]) 03/10/2024   Aborted "my hands went numb"   Vitamin D  insufficiency 03/10/2024    History reviewed. No pertinent surgical history.  Family History(Medical and  Psychiatric):  Mother has mental health issues   Social History:  Social History   Substance and Sexual Activity  Alcohol Use Not Currently     Social History   Substance and Sexual Activity  Drug Use Not Currently   Types: Marijuana    Social History   Socioeconomic History   Marital status: Married    Spouse name: Not on file   Number of children: Not on file   Years of education: Not on file   Highest education level: Not on file  Occupational History   Not on file  Tobacco Use   Smoking status: Former    Current packs/day: 0.50    Types: Cigarettes   Smokeless tobacco: Never   Tobacco comments:    pack of cigerettes last a month  Vaping Use   Vaping status: Never Used  Substance and Sexual Activity   Alcohol use: Not Currently   Drug use: Not Currently    Types: Marijuana   Sexual activity: Not on file  Other Topics Concern   Not on file  Social History Narrative   Not on file   Social Drivers of Health   Financial Resource Strain: Not on file  Food Insecurity: No Food Insecurity (03/09/2024)   Hunger Vital Sign    Worried About Running Out of Food in the Last Year: Never true    Ran Out of Food in the Last Year: Never true  Transportation Needs: Unmet Transportation Needs (03/09/2024)   PRAPARE - Administrator, Civil Service (Medical): Yes    Lack of Transportation (Non-Medical): Yes  Physical Activity: Not on file  Stress: Not on file  Social Connections: Not on file        Current Medications: Current Facility-Administered  Medications  Medication Dose Route Frequency Provider Last Rate Last Admin   acetaminophen  (TYLENOL ) tablet 650 mg  650 mg Oral Q6H PRN Buena Carmine, NP       alum & mag hydroxide-simeth (MAALOX/MYLANTA) 200-200-20 MG/5ML suspension 30 mL  30 mL Oral Q4H PRN Buena Carmine, NP       amLODipine  (NORVASC ) tablet 10 mg  10 mg Oral Daily Buena Carmine, NP   10 mg at 03/11/24 1191   haloperidol   (HALDOL ) tablet 5 mg  5 mg Oral TID PRN Buena Carmine, NP       And   diphenhydrAMINE  (BENADRYL ) capsule 50 mg  50 mg Oral TID PRN Buena Carmine, NP       haloperidol  lactate (HALDOL ) injection 5 mg  5 mg Intramuscular TID PRN Buena Carmine, NP       And   diphenhydrAMINE  (BENADRYL ) injection 50 mg  50 mg Intramuscular TID PRN Buena Carmine, NP       And   LORazepam  (ATIVAN ) injection 2 mg  2 mg Intramuscular TID PRN Buena Carmine, NP       haloperidol  lactate (HALDOL ) injection 10 mg  10 mg Intramuscular TID PRN Buena Carmine, NP       And   diphenhydrAMINE  (BENADRYL ) injection 50 mg  50 mg Intramuscular TID PRN Buena Carmine, NP       And   LORazepam  (ATIVAN ) injection 2 mg  2 mg Intramuscular TID PRN Buena Carmine, NP       [START ON 03/12/2024] escitalopram  (LEXAPRO ) tablet 10 mg  10 mg Oral Daily Timmothy Foots, MD       hydrOXYzine  (ATARAX ) tablet 25 mg  25 mg Oral TID PRN Buena Carmine, NP   25 mg at 03/10/24 1323   hydrOXYzine  (ATARAX ) tablet 50 mg  50 mg Oral TID Val Schiavo S, MD       lisinopril  (ZESTRIL ) tablet 20 mg  20 mg Oral Daily Kadelyn Dimascio S, MD   20 mg at 03/11/24 4782   magnesium  hydroxide (MILK OF MAGNESIA) suspension 30 mL  30 mL Oral Daily PRN Buena Carmine, NP       traZODone  (DESYREL ) tablet 50 mg  50 mg Oral QHS PRN Buena Carmine, NP       Vitamin D  (Ergocalciferol ) (DRISDOL ) 1.25 MG (50000 UNIT) capsule 50,000 Units  50,000 Units Oral Daily Makayah Pauli S, MD   50,000 Units at 03/11/24 9562    Lab Results:  Results for orders placed or performed during the hospital encounter of 03/09/24 (from the past 48 hours)  VITAMIN D  25 Hydroxy (Vit-D Deficiency, Fractures)     Status: Abnormal   Collection Time: 03/09/24  6:33 PM  Result Value Ref Range   Vit D, 25-Hydroxy 26.31 (L) 30 - 100 ng/mL    Comment: (NOTE) Vitamin D  deficiency has been defined by the Institute of Medicine  and an Endocrine Society  practice guideline as a level of serum 25-OH  vitamin D  less than 20 ng/mL (1,2). The Endocrine Society went on to  further define vitamin D  insufficiency as a level between 21 and 29  ng/mL (2).  1. IOM (Institute of Medicine). 2010. Dietary reference intakes for  calcium and D. [place]: [employer]. 2. Holick MF, Binkley [place], Bischoff-Ferrari HA, et al. Evaluation,  treatment, and prevention of vitamin D  deficiency: an Endocrine  Society clinical practice guideline, JCEM. 2011  Jul; 96(7): 1911-30.  Performed at [facility], [address], [place], [place] [address]   Hemoglobin A1c     Status: None   Collection Time: 03/10/24  6:19 AM  Result Value Ref Range   Hgb A1c MFr Bld 5.2 4.8 - 5.6 %    Comment: (NOTE) Diagnosis of Diabetes The following HbA1c ranges recommended by the American Diabetes Association (ADA) may be used as an aid in the diagnosis of diabetes mellitus.  Hemoglobin             Suggested A1C NGSP%              Diagnosis  <5.7                   Non Diabetic  5.7-6.4                Pre-Diabetic  >6.4                   Diabetic  <7.0                   Glycemic control for                       adults with diabetes.     Mean Plasma Glucose 102.54 mg/dL    Comment: Performed at [facility], [address], [place], [place] [address]  TSH     Status: None   Collection Time: 03/10/24  6:19 AM  Result Value Ref Range   TSH 2.775 0.350 - 4.500 uIU/mL    Comment: Performed by a 3rd Generation assay with a functional sensitivity of <=0.01 uIU/mL. Performed at [facility], [address] [place], [place] [address]   Lipid panel     Status: Abnormal   Collection Time: 03/10/24  6:19 AM  Result Value Ref Range   Cholesterol 130 0 - 200 mg/dL   Triglycerides 46 <914 mg/dL   HDL 36 (L) >78 mg/dL   Total CHOL/HDL Ratio 3.6 RATIO   VLDL 9 0 - 40 mg/dL   LDL Cholesterol 85 0 - 99 mg/dL    Comment:         Total Cholesterol/HDL:CHD Risk Coronary Heart Disease Risk Table                     Men   Women  1/2 Average Risk   3.4   3.3  Average Risk       5.0   4.4  2 X Average Risk   9.6   7.1  3 X Average Risk  23.4   11.0        Use the calculated Patient Ratio above and the CHD Risk Table to determine the patient's CHD Risk.        ATP III CLASSIFICATION (LDL):  <100     mg/dL   Optimal  295-621  mg/dL   Near or Above                    Optimal  130-159  mg/dL   Borderline  308-657  mg/dL   High  >846     mg/dL   Very High Performed at [facility], [address] [place], [place] [address]   Folate     Status: None   Collection Time: 03/10/24  6:44 PM  Result Value Ref Range   Folate  9.2 >5.9 ng/mL    Comment: Performed at [facility], [address], [place], [place] [address]  RPR     Status: None   Collection Time: 03/10/24  6:44 PM  Result Value Ref Range   RPR Ser Ql NON REACTIVE NON REACTIVE    Comment: Performed at [facility], [address] [place], [place] [address]  Vitamin B12     Status: None   Collection Time: 03/10/24  6:44 PM  Result Value Ref Range   Vitamin B-12 316 180 - 914 pg/mL    Comment: (NOTE) This assay is not validated for testing neonatal or myeloproliferative syndrome specimens for Vitamin B12 levels. Performed at [facility], [address], [place], [place] [address]   HIV Antibody (routine testing w rflx)     Status: None   Collection Time: 03/10/24  6:44 PM  Result Value Ref Range   HIV Screen 4th Generation wRfx Non Reactive Non Reactive    Comment: Performed at [facility], [address], [place], [place] [address]    Blood Alcohol level:  Lab Results  Component Value Date   [facility] <15 03/09/2024    Metabolic Disorder Labs: Lab Results  Component Value Date   HGBA1C 5.2 03/10/2024   MPG 102.54 03/10/2024   No results found for: "PROLACTIN" Lab  Results  Component Value Date   CHOL 130 03/10/2024   TRIG 46 03/10/2024   HDL 36 (L) 03/10/2024   CHOLHDL 3.6 03/10/2024   VLDL 9 03/10/2024   LDLCALC 85 03/10/2024    Physical Findings: AIMS:  , ,  ,  ,    CIWA:    COWS:     Psychiatric Specialty Exam:  Presentation  General Appearance: Fairly Groomed  Eye Contact: Good  Speech: Slow  Speech Volume: Decreased  Handedness: Right   Mood and Affect  Mood: Depressed; Anxious  Affect: Restricted; Congruent   Thought Process  Thought Processes: Linear  Descriptions of Associations: Intact  Orientation: Full (Time, Place and Person)  Thought Content: Logical  History of Schizophrenia/Schizoaffective disorder: No  Duration of Psychotic Symptoms: NA Hallucinations: Hallucinations: None  Ideas of Reference: None  Suicidal Thoughts: Suicidal Thoughts: No  Homicidal Thoughts: Homicidal Thoughts: No   Sensorium  Memory: Immediate Good; Recent Good  Judgment: Good  Insight: Good   Executive Functions  Concentration: Fair  Attention Span: Good  Recall: Good  Fund of Knowledge: Good  Language: Good   Psychomotor Activity  Psychomotor Activity: Psychomotor Activity: Restlessness   Assets  Assets: Communication Skills; Desire for Improvement; Housing   Sleep  Sleep: Sleep: Fair   Musculoskeletal: Strength & Muscle Tone: within normal limits Gait & Station: normal Patient leans: N/A   Physical Exam: General: Sitting comfortably. NAD. HEENT: Normocephalic, atraumatic, MMM, EMOI Lungs: no increased work of breathing noted Heart: no cyanosis Abdomen: Non distended Musculoskeletal: FROM. No obvious deformities Skin: Warm, dry, intact. No rashes noted Neuro: No obvious focal deficits.  Gait and station are normal  Review of Systems  Constitutional: Negative.   HENT: Negative.    Eyes: Negative.   Respiratory: Negative.    Cardiovascular: Negative.   Gastrointestinal: Negative.    Genitourinary: Negative.   Skin: Negative.   Neurological: Negative.   Psychiatric/Behavioral:  Positive for depression, anxiety.     Blood pressure 126/89, pulse 84, temperature 98.3 F (36.8 C), temperature source Oral, resp. rate 16, height 5\' 5"  (1.651 m), weight 95.3 kg, SpO2  97%. Body mass index is 34.95 kg/m.  ASSESSMENT: Lance Gutierrez is an 38 y.o. male who  has a past medical history of psychiatric hospitalization (03/10/2024), Hypertension, Involuntary commitment (03/10/2024), Major depressive disorder, single episode, severe without psychotic features (HCC) (03/10/2024), Suicide attempt by firearm [facility]) (03/10/2024), and Vitamin D  insufficiency (03/10/2024).  He presented on 03/09/2024  3:16 PM for Major depressive disorder, single episode, severe without psychotic features (HCC).    Diagnoses / Active Problems: Patient Active Problem List   Diagnosis Date Noted   Vitamin D  insufficiency 03/10/2024   Major depressive disorder, single episode, severe without psychotic features (HCC) 03/09/2024   Suicidal behavior without attempted self-injury 03/09/2024      PLAN: Safety and Monitoring:  -- Involuntary admission to inpatient psychiatric unit for safety, stabilization and treatment  -- Daily contact with patient to assess and evaluate symptoms and progress in treatment  -- Patient's case to be discussed in multi-disciplinary team meeting  -- Observation Level : q15 minute checks  -- Vital signs:  q12 hours  -- Precautions: suicide, elopement, and assault  2. Psychiatric Diagnoses and Treatment:  Patient Active Problem List   Diagnosis Date Noted   Vitamin D  insufficiency 03/10/2024   Major depressive disorder, single episode, severe without psychotic features (HCC) 03/09/2024   Suicidal behavior without attempted self-injury 03/09/2024     Scheduled Medications:  amLODipine   10 mg Oral Daily   [START ON 03/12/2024] escitalopram   10 mg Oral Daily   hydrOXYzine   50 mg Oral  TID   lisinopril   20 mg Oral Daily   Vitamin D  (Ergocalciferol )  50,000 Units Oral Daily    As Needed Medications: acetaminophen , alum & mag hydroxide-simeth, haloperidol  **AND** diphenhydrAMINE , haloperidol  lactate **AND** diphenhydrAMINE  **AND** LORazepam , haloperidol  lactate **AND** diphenhydrAMINE  **AND** LORazepam , hydrOXYzine , magnesium  hydroxide, traZODone     3. Medical Issues Being Addressed:   -- Hypovitaminosis D as above  Labs reviewed, unremarkable with the exception of: Hypovitaminosis D     4. Discharge Planning:   -- Social work and case management to assist with discharge planning and identification of hospital follow-up needs prior to discharge  -- Estimated LOS: Likely discharge tomorrow  -- Discharge Concerns: Need to establish a safety plan; Medication compliance and effectiveness  -- Discharge Goals: Return home with outpatient referrals for mental health follow-up including medication management/psychotherapy  5. Short Term Goals:  Improve ability to identify changes in lifestyle to reduce recurrence of condition, verbalize feelings, disclose and discuss suicidal ideas, demonstrate self-control, identify and develop effective coping behaviors, compliance with prescribed medications, identify triggers associated with substance abuse/mental health issues, participate in unit milieu and in scheduled group therapies   6. Long Term Goals: Improvement in symptoms so the patient is ready for discharge   --The risks/benefits/side-effects/alternatives to the medications above were discussed in detail with the patient and time was given for questions. The patient provided informed consent.   -- Metabolic profile and EKG monitoring obtained while on an atypical antipsychotic and listed in the EHR    Total Time Spent in Direct Patient Care:  I personally spent 35 minutes on the unit in direct patient care. The direct patient care time included face-to-face time with the  patient, reviewing the patient's chart, communicating with other professionals, and coordinating care. Greater than 50% of this time was spent in counseling or coordinating care with the patient regarding goals of hospitalization, psycho-education, and discharge planning needs.      Clair Crews, MD Psychiatrist  03/11/2024, 12:02 PM  I certify that inpatient services furnished can reasonably be expected to improve the patient's condition.    Portions of this note were created using voice recognition software. Minor syntax errors, grammatical content, spelling, or punctuation errors may have occurred unintentionally. Please notify the [facility] if the meaning of any statement is unclear.

## 2024-03-11 NOTE — Progress Notes (Addendum)
 D. Pt presents pretty anxious, but is friendly during interactions- has been visible in the milieu, observed attending group. Per pt's self inventory, pt rated his depression,hopelessness and anxiety all 0's.. Pt reported sleeping well last night, described his appetite and concentration as 'good', and energy level as 'high'. Pt's stated goal today is to work on "getting out of here."  Pt currently denies SI/HI and AVH and doesn't appear to be responding to internal stimuli.   A. Labs and vitals monitored. Pt given and educated on medications. Pt supported emotionally and encouraged to express concerns and ask questions.   R. Pt remains safe with 15 minute checks. Will continue POC.    03/11/24 1400  Psych Admission Type (Psych Patients Only)  Admission Status Involuntary  Psychosocial Assessment  Patient Complaints None  Eye Contact Fair  Facial Expression Anxious  Affect Anxious  Speech Logical/coherent  Interaction Assertive  Motor Activity Other (Comment) (steady gait)  Appearance/Hygiene Unremarkable  Behavior Characteristics Cooperative  Mood Pleasant;Anxious  Thought Process  Coherency WDL  Content WDL  Delusions None reported or observed  Perception WDL  Hallucination None reported or observed  Judgment Impaired  Confusion None  Danger to Self  Current suicidal ideation? Denies  Danger to Others  Danger to Others None reported or observed

## 2024-03-11 NOTE — Plan of Care (Signed)
   Problem: Education: Goal: Emotional status will improve Outcome: Progressing Goal: Mental status will improve Outcome: Progressing   Problem: Activity: Goal: Interest or engagement in activities will improve Outcome: Progressing

## 2024-03-11 NOTE — Group Note (Signed)
 LCSW Group Therapy Note   Date/Time:  @TD @ 11:30am-12:00pm  Type of Therapy and Topic:  Group Therapy:  commitment  Participation Level:  Active   Description of Group This process group involved a discussion with and between patients about commitment.   That is, much change occurs by making a plan to do something and then doing it, no matter how one feels at the time. , then examples were elicited from group members for the payoff is that self-esteem builds with accomplishment.  This then was followed by a discussion about how the patient are committed to their self, what it is, how important it is, and why we choose the coping techniques we choose.  Participants were encouraged to think about commitment as necessary and positive.  Therapeutic Goals Patient will identify and describe one commitment Patient will participate in generating ideas about being committed to themselves and how it feels when you accomplishment the commitment What is the patient self-esteem level and how they feel about themself The patient are encourage to commit on a work sheet listing their promise and how it will get accomplish  Summary of Patient Progress:  The patient was 30 mins late and share that he is committed to driving cautiously because he is a [employer]. The patient share that he want to commit to something but always on the road and does not have    Therapeutic Modalities Brief Solution-Focused Therapy Psychoeducation Lance Gutierrez, LCSWA 03/11/2024  5:02 PM

## 2024-03-11 NOTE — Progress Notes (Signed)
   03/11/24 2200  Psych Admission Type (Psych Patients Only)  Admission Status Involuntary  Psychosocial Assessment  Patient Complaints Anxiety  Eye Contact Fair  Facial Expression Anxious  Affect Appropriate to circumstance  Speech Logical/coherent  Interaction Assertive  Motor Activity Slow  Appearance/Hygiene Unremarkable  Behavior Characteristics Cooperative;Appropriate to situation  Mood Anxious;Pleasant  Thought Process  Coherency WDL  Content WDL  Delusions None reported or observed  Perception WDL  Hallucination None reported or observed  Judgment Poor  Confusion None  Danger to Self  Current suicidal ideation? Denies  Agreement Not to Harm Self Yes  Description of Agreement verbal  Danger to Others  Danger to Others None reported or observed

## 2024-03-11 NOTE — Group Note (Signed)
 Date:  03/11/2024 Time:  9:15 AM  Group Topic/Focus:  Goals Group:   The focus of this group is to help patients establish daily goals to achieve during treatment and discuss how the patient can incorporate goal setting into their daily lives to aide in recovery.    Participation Level:  Active  Participation Quality:  Attentive  Affect:  Appropriate  Cognitive:  Appropriate  Insight: Appropriate  Engagement in Group:  Engaged  Modes of Intervention:  Discussion  Additional Comments:  Pt goal" To be a better person today than I was yesterday."  Vallarie Gauze 03/11/2024, 9:15 AM

## 2024-03-11 NOTE — Progress Notes (Signed)
 Pt visible in the milieu.  Interacting appropriate with staff and peers.  Pt rated depression at 0 and stated the anxiety he is experiencing is because he is worried about his dogs.  Pt sated it is a help to be here to get away from what he has been having to deal with.  Pt's BP was elevated.  Day shift nurse reported he refused his lisinopril .  BP remained eleveated upon recheck during night shift.  Pt agreed to and lisinopril  was administered.  Pt stated he thought the medication was for depression earlier.  Pt denied symptoms.  Upon recheck BP trending down.  (See flowsheet).  Needs assessed.  Pt denied.  Pt denied SI, HI and AVH.  Supportive listening provided.  Pt receptive.  Safety checks continue for patient safety.  Pt safe on unit.

## 2024-03-11 NOTE — Plan of Care (Signed)

## 2024-03-12 DIAGNOSIS — F322 Major depressive disorder, single episode, severe without psychotic features: Secondary | ICD-10-CM | POA: Diagnosis not present

## 2024-03-12 MED ORDER — CHOLECALCIFEROL 125 MCG (5000 UT) PO TABS: 1.0000 | ORAL_TABLET | Freq: Every day | ORAL | 0 refills | Status: AC

## 2024-03-12 MED ORDER — HYDROXYZINE HCL 50 MG PO TABS: 50.0000 mg | ORAL_TABLET | Freq: Three times a day (TID) | ORAL | 0 refills | Status: AC | PRN

## 2024-03-12 MED ORDER — AMLODIPINE BESYLATE 10 MG PO TABS: 10.0000 mg | ORAL_TABLET | Freq: Every day | ORAL | 0 refills | Status: AC

## 2024-03-12 MED ORDER — LISINOPRIL 20 MG PO TABS: 20.0000 mg | ORAL_TABLET | Freq: Every day | ORAL | 0 refills | Status: AC

## 2024-03-12 MED ORDER — ESCITALOPRAM OXALATE 10 MG PO TABS: 10.0000 mg | ORAL_TABLET | Freq: Every day | ORAL | 0 refills | Status: AC

## 2024-03-12 MED ORDER — VITAMIN D (ERGOCALCIFEROL) 1.25 MG (50000 UNIT) PO CAPS: 50000.0000 [IU] | ORAL_CAPSULE | ORAL | 0 refills | Status: AC
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
## 2024-03-12 NOTE — Group Note (Signed)
 Date:  03/12/2024 Time:  5:25 PM  Group Topic/Focus:   Setbacks in Recovery:   The focus of this group is to identify unhealthy thought patterns and develop a plan to handle them in a healthier way upon discharge.    Participation Level:  Did Not Attend   Lance Gutierrez 03/12/2024, 5:25 PM

## 2024-03-12 NOTE — Progress Notes (Signed)
   03/12/24 0900  Charting Type  Charting Type Shift Assessment  Neurological  Neuro (WDL) WDL  HEENT  HEENT (WDL) WDL  Respiratory  Respiratory (WDL) WDL  Cardiac  Cardiac (WDL) X (htn)  Vascular  Vascular (WDL) WDL  Musculoskeletal  Musculoskeletal (WDL) WDL  Assistive Device None  Gastrointestinal  Gastrointestinal (WDL) WDL  GU Assessment  Genitourinary (WDL) WDL  Integumentary  Integumentary (WDL) WDL  Braden Scale (Ages 8 and up)  Sensory Perceptions 4  Moisture 4  Activity 4  Mobility 4  Nutrition 3  Friction and Shear 3  Braden Scale Score 22  Neurological  Level of Consciousness Alert

## 2024-03-12 NOTE — Progress Notes (Signed)
Pt discharged to lobby. Pt was stable and appreciative at that time. All papers and electronic prescriptions were given and valuables returned. Suicide safety plan completed. Verbal understanding expressed. Denies SI/HI and A/VH. Pt given opportunity to express concerns and ask questions. 

## 2024-03-12 NOTE — [facility] Suicide Risk Assessment (Signed)
 [facility] Discharge Suicide Risk Assessment   Principal Problem: Major depressive disorder, single episode, severe without psychotic features (HCC)  Discharge Diagnoses: Principal Problem:   Major depressive disorder, single episode, severe without psychotic features (HCC) Active Problems:   Vitamin D  insufficiency      Total Time spent with patient: 40  Musculoskeletal: Strength & Muscle Tone: within normal limits Gait & Station: normal Patient leans: N/A   Psychiatric Specialty Exam:  Presentation  General Appearance: Appropriate for Environment  Eye Contact: Good  Speech: Clear and Coherent  Speech Volume: Normal  Handedness: Right   Mood and Affect  Mood: Anxious  Affect: Congruent; Restricted   Thought Process  Thought Processes: Coherent  Descriptions of Associations: Intact  Orientation: Full (Time, Place and Person)  Thought Content: Logical  History of Schizophrenia/Schizoaffective disorder: No  Duration of Psychotic Symptoms: NA Hallucinations: Hallucinations: None  Ideas of Reference: None  Suicidal Thoughts: Suicidal Thoughts: No  Homicidal Thoughts: Homicidal Thoughts: No   Sensorium  Memory: Immediate Good; Recent Good  Judgment: Good  Insight: Good   Executive Functions  Concentration: Good  Attention Span: Good  Recall: Good  Fund of Knowledge: Good  Language: Good   Psychomotor Activity  Psychomotor Activity: Psychomotor Activity: Normal   Assets  Assets: Communication Skills; Physical Health; Financial Resources/Insurance; Desire for Improvement; Housing   Sleep  Sleep: Sleep: Good   Physical Exam: General: Sitting comfortably. NAD. HEENT: Normocephalic, atraumatic, MMM, EMOI Lungs: no increased work of breathing noted Heart: no cyanosis Abdomen: Non distended Musculoskeletal: FROM. No obvious deformities Skin: Warm, dry, intact. No rashes noted Neuro: No obvious focal deficits.  Gait and station are  normal  Review of Systems  Constitutional: Negative.   HENT: Negative.    Eyes: Negative.   Respiratory: Negative.    Cardiovascular: Negative.   Gastrointestinal: Negative.   Genitourinary: Negative.   Skin: Negative.   Neurological: Negative.   Psychiatric/Behavioral:  Negative   Mental Status Per Nursing Assessment: Suicidal ideation indicated by patient, Plan includes specific time, place, or method, Intention to act on suicide plan, Belief that plan would result in death  Demographic Factors:  Male and Living alone  Loss Factors: Loss of significant relationship  Historical Factors: Family history of mental illness or substance abuse  Risk Reduction Factors:   Religious beliefs about death, Employed, Positive therapeutic relationship, and Positive coping skills or problem solving skills  Continued Clinical Symptoms:  Previous psychiatric diagnoses and treatments  Cognitive Features That Contribute To Risk:  None  Suicide Risk:  Minimal: No identifiable suicidal ideation.  Patients presenting with no risk factors but with morbid ruminations; may be classified as minimal risk based on the severity of the depressive symptoms.    Follow-up Information     [facility] Go to.   Contact information: [address] [place] [address] [phone]                  Plan Of Care/Follow-up recommendations:  Activity: as tolerated  Diet: heart healthy  Other: -Follow-up with your outpatient psychiatric provider -instructions on appointment date, time, and address (location) are provided to you in discharge paperwork.  -Take your psychiatric medications as prescribed at discharge - instructions are provided to you in the discharge paperwork  -Follow-up with outpatient primary care doctor and other specialists -for management of preventative medicine and chronic medical issues  -Testing: Follow-up with outpatient provider  for any abnormal lab results (if any)  -If you are  prescribed an atypical antipsychotic medication, we recommend that your outpatient psychiatrist follow routine screening for side effects within 3 months of discharge, including monitoring: AIMS scale, height, weight, blood pressure, fasting lipid panel, HbA1c, and fasting blood sugar.   -Recommend total abstinence from alcohol, tobacco, and other illicit drug use at discharge.   -If your psychiatric symptoms recur, worsen, or if you have side effects to your psychiatric medications, call your outpatient psychiatric provider, 911, [phone] or go to the nearest emergency department.  -If suicidal thoughts occur, immediately call your outpatient psychiatric provider, 911, [phone] or go to the nearest emergency department.   Clair Crews, MD 03/12/24 11:13 AM

## 2024-03-12 NOTE — Progress Notes (Signed)
  [facility] Adult Case Management Discharge Plan :  Will you be returning to the same living situation after discharge:  Yes,  patient will return to same living situation.  At discharge, do you have transportation home?: Yes,  patient reports that a friend will be picking patient up at 130 2 pm today.  Do you have the ability to pay for your medications: Yes,  patient has a form of medicaid listed in the chart.   Release of information consent forms completed and in the chart;  Patient's signature needed at discharge.  Patient to Follow up at:  Follow-up Information     [facility] Go to.   Contact information: [address] [place] [address] [phone]                 Next level of care provider has access to [facility] Link:no  Safety Planning and Suicide Prevention discussed: Yes,  previous CSW spoke to patient's friend in regards to SPE.      Has patient been referred to the Quitline?: Patient refused referral for treatment  Patient has been referred for addiction treatment: Patient refused referral for treatment.  Mariano Shiver, LCSW 03/12/2024, 10:25 AM

## 2024-03-12 NOTE — Group Note (Signed)
 Date:  03/12/2024 Time:  9:39 AM  Group Topic/Focus:  Goals Group:   The focus of this group is to help patients establish daily goals to achieve during treatment and discuss how the patient can incorporate goal setting into their daily lives to aide in recovery. Orientation:   The focus of this group is to educate the patient on the purpose and policies of crisis stabilization and provide a format to answer questions about their admission.  The group details unit policies and expectations of patients while admitted.    Participation Level:  Active  Participation Quality:  Appropriate  Affect:  Appropriate  Cognitive:  Appropriate  Insight: Appropriate  Engagement in Group:  Engaged  Modes of Intervention:  Discussion and Orientation  Additional Comments:    Lance Gutierrez 03/12/2024, 9:39 AM

## 2024-03-19 NOTE — ED Provider Notes (Signed)
  EMERGENCY DEPARTMENT AT [facility] Provider Note   CSN: [id number] Arrival date & time: 03/09/24  0535     History  Chief Complaint  Patient presents with   Suicidal    Lance Gutierrez is a 38 y.o. male.  Patient was brought to the emergency department by police.  Patient was reportedly going to shoot himself in the head but then thought better of it and called 911.       Home Medications Prior to Admission medications   Medication Sig Start Date End Date Taking? Authorizing Provider  amLODipine  (NORVASC ) 10 MG tablet Take 1 tablet (10 mg total) by mouth daily. 03/13/24   Timmothy Foots, MD  Cholecalciferol  125 MCG (5000 UT) TABS Take 1 tablet (5,000 Units total) by mouth daily. 03/12/24   Timmothy Foots, MD  escitalopram  (LEXAPRO ) 10 MG tablet Take 1 tablet (10 mg total) by mouth daily. 03/13/24   Timmothy Foots, MD  hydrOXYzine  (ATARAX ) 50 MG tablet Take 1 tablet (50 mg total) by mouth 3 (three) times daily as needed. 03/12/24 04/11/24  Timmothy Foots, MD  lisinopril  (ZESTRIL ) 20 MG tablet Take 1 tablet (20 mg total) by mouth daily. 03/13/24   Timmothy Foots, MD  Vitamin D , Ergocalciferol , (DRISDOL ) 1.25 MG (50000 UNIT) CAPS capsule Take 1 capsule (50,000 Units total) by mouth every 7 (seven) days. 03/12/24   Timmothy Foots, MD      Allergies    Penicillins    Review of Systems   Review of Systems  Physical Exam Updated Vital Signs BP (!) 199/130 (BP Location: Right Arm)   Pulse 73   Temp 98.1 F (36.7 C) (Oral)   Resp 17   Ht 5\' 6"  (1.676 m)   Wt 96.2 kg   SpO2 92%   BMI 34.22 kg/m  Physical Exam Vitals and nursing note reviewed.  Constitutional:      General: He is not in acute distress.    Appearance: He is well-developed.  HENT:     Head: Normocephalic and atraumatic.     Mouth/Throat:     Mouth: Mucous membranes are moist.  Eyes:     General: Vision grossly intact. Gaze aligned appropriately.     Extraocular Movements: Extraocular  movements intact.     Conjunctiva/sclera: Conjunctivae normal.  Cardiovascular:     Rate and Rhythm: Normal rate and regular rhythm.     Pulses: Normal pulses.     Heart sounds: Normal heart sounds, S1 normal and S2 normal. No murmur heard.    No friction rub. No gallop.  Pulmonary:     Effort: Pulmonary effort is normal. No respiratory distress.     Breath sounds: Normal breath sounds.  Abdominal:     Palpations: Abdomen is soft.     Tenderness: There is no abdominal tenderness. There is no guarding or rebound.     Hernia: No hernia is present.  Musculoskeletal:        General: No swelling.     Cervical back: Full passive range of motion without pain, normal range of motion and neck supple. No pain with movement, spinous process tenderness or muscular tenderness. Normal range of motion.     Right lower leg: No edema.     Left lower leg: No edema.  Skin:    General: Skin is warm and dry.     Capillary Refill: Capillary refill takes less than 2 seconds.     Findings: No ecchymosis, erythema, lesion  or wound.  Neurological:     Mental Status: He is alert and oriented to person, place, and time.     GCS: GCS eye subscore is 4. GCS verbal subscore is 5. GCS motor subscore is 6.     Cranial Nerves: Cranial nerves 2-12 are intact.     Sensory: Sensation is intact.     Motor: Motor function is intact. No weakness or abnormal muscle tone.     Coordination: Coordination is intact.  Psychiatric:        Mood and Affect: Mood is depressed.        Speech: Speech normal.        Behavior: Behavior normal.        Thought Content: Thought content includes suicidal ideation.     ED Results / Procedures / Treatments   Labs (all labs ordered are listed, but only abnormal results are displayed) Labs Reviewed  CBC - Abnormal; Notable for the following components:      Result Value   WBC 12.9 (*)    MCV 79.7 (*)    All other components within normal limits  COMPREHENSIVE METABOLIC PANEL WITH  GFR - Abnormal; Notable for the following components:   Glucose, Bld 115 (*)    All other components within normal limits  RAPID URINE DRUG SCREEN, HOSP PERFORMED - Abnormal; Notable for the following components:   Tetrahydrocannabinol POSITIVE (*)    All other components within normal limits  ETHANOL    EKG EKG Interpretation Date/Time:  Thursday Mar 09 2024 10:18:58 EDT Ventricular Rate:  78 PR Interval:  152 QRS Duration:  92 QT Interval:  390 QTC Calculation: 444 R Axis:   87  Text Interpretation: Normal sinus rhythm Artifact Abnormal ECG No previous ECGs available Confirmed by Clay Cummins [phone]) on 03/10/2024 3:42:03 PM  Radiology No results found.  Procedures Procedures    Medications Ordered in ED Medications  amLODipine  (NORVASC ) tablet 10 mg (10 mg Oral Given 03/09/24 1117)    ED Course/ Medical Decision Making/ A&P                                 Medical Decision Making Amount and/or Complexity of Data Reviewed Labs: ordered.  Risk Prescription drug management. Decision regarding hospitalization.   Presents to the emergency department with suicidal ideation and a plan.  Patient initiated the attempt earlier but then got better arrival.  Will require psychiatric evaluation and treatment.        Final Clinical Impression(s) / ED Diagnoses Final diagnoses:  Suicidal ideation  Hypertension, unspecified type    Rx / DC Orders ED Discharge Orders     None         Keslee Harrington, Marine Sia, MD 03/19/24 0004
